# Patient Record
Sex: Female | Born: 1980 | Race: White | Hispanic: No | Marital: Married | Smoking: Never smoker
Health system: Southern US, Community
[De-identification: ages and names within clinical notes are randomized; demographics above are authoritative.]

## PROBLEM LIST (undated history)

## (undated) DIAGNOSIS — T7840XA Allergy, unspecified, initial encounter: Secondary | ICD-10-CM

## (undated) HISTORY — PX: APPENDECTOMY: SHX54

## (undated) HISTORY — DX: Allergy, unspecified, initial encounter: T78.40XA

---

## 2011-06-19 ENCOUNTER — Ambulatory Visit: Payer: Self-pay | Admitting: Family Medicine

## 2011-06-19 VITALS — BP 91/60 | HR 60 | Temp 97.8°F | Resp 16 | Ht 66.0 in | Wt 163.4 lb

## 2011-06-19 DIAGNOSIS — J310 Chronic rhinitis: Secondary | ICD-10-CM

## 2011-06-19 DIAGNOSIS — H6120 Impacted cerumen, unspecified ear: Secondary | ICD-10-CM

## 2011-06-19 MED ORDER — FLUTICASONE PROPIONATE 50 MCG/ACT NA SUSP: 2.0000 | Freq: Every day | NASAL | Status: DC

## 2011-06-19 MED ORDER — AZELASTINE HCL 0.15 % NA SOLN: 2.0000 | Freq: Two times a day (BID) | NASAL | Status: DC

## 2011-06-19 MED ORDER — AZITHROMYCIN 250 MG PO TABS: ORAL_TABLET | ORAL | Status: AC

## 2011-06-19 NOTE — Progress Notes (Signed)
Subjective: 31 year old lady with a history of having had a little cold last week. She is a little Zicam for that intermittently. She was feeling better. Then sent Monday she has had nasal congestion and frontal sinus and maxillary sinus type pressure. Her ears feel stuffy. No fever. Not much of a cough or sore throat now.  Objective: TMs are both occluded by cerumen it's soft-looking wax. Her nose as pale swelling of the turbinates and is closed up completely. Throat was clear. Neck supple without significant nodes. Chest is clear to auscultation. Heart regular.  Assessment: Allergic or viral rhinitis Recent URI Cerumen impaction  Plan: Treat symptomatically. If she is not doing better over the next several days she can start an oral course of antibiotics, but it is not or weakness there.

## 2011-06-19 NOTE — Patient Instructions (Signed)
Use Debrox in ears  If worse next few days take the antibiotics (zithromax)

## 2012-01-03 ENCOUNTER — Ambulatory Visit (INDEPENDENT_AMBULATORY_CARE_PROVIDER_SITE_OTHER): Payer: BLUE CROSS/BLUE SHIELD | Admitting: Family Medicine

## 2012-01-03 VITALS — BP 95/61 | HR 66 | Temp 98.2°F | Resp 16 | Ht 66.5 in | Wt 164.6 lb

## 2012-01-03 DIAGNOSIS — T7840XA Allergy, unspecified, initial encounter: Secondary | ICD-10-CM

## 2012-01-03 MED ORDER — CETIRIZINE HCL 10 MG PO TABS: 10.0000 mg | ORAL_TABLET | Freq: Every day | ORAL | Status: AC

## 2012-01-03 MED ORDER — RANITIDINE HCL 300 MG PO TABS: 300.0000 mg | ORAL_TABLET | Freq: Two times a day (BID) | ORAL | Status: DC

## 2012-01-03 MED ORDER — METHYLPREDNISOLONE ACETATE 80 MG/ML IJ SUSP
80.0000 mg | Freq: Once | INTRAMUSCULAR | Status: AC
  Administered 2012-01-03: 80 mg via INTRAMUSCULAR

## 2012-01-03 NOTE — Patient Instructions (Addendum)
Angioedema Angioedema (AE) is a sudden swelling of the eyelids, lips, lobes of ears, external genitalia, skin, and other parts of the body. AE can happen by itself. It usually begins during the night and is found on awakening. It can happen with hives and other allergic reactions. Attacks can be mild and annoying, or life-threatening if the air passages swell. AE generally occurs in a short time period (over minutes to hours) and gets better in 24 to 48 hours. It usually does not cause any serious problems.  There are 2 different kinds of AE:   Allergic AE.  Nonallergic AE.  There may be an overreaction or direct stimulation of cells that are a part of the immune system (mast cells).  There may be problems with the release of chemicals made by the body that cause swelling and inflammation (kinins). AE due to kinins can be inherited from parents (hereditary), or it can develop on its own (acquired). Acquired AE either shows up before, or along with, certain diseases or is due to the body's immune system attacking parts of the body's own cells (autoimmune). CAUSES  Allergic  AE due to allergic reactions are caused by something that causes the body to react (trigger). Common triggers include:  Foods.  Medicines.  Latex.  Direct contact with certain fruits, vegetables, or animal saliva.  Insect stings. Nonallergic  Mast cell stimulation may be caused by:  Medicines.  Dyes used in X-rays.  The body's own immune system reactions to parts of the body (autoimmune disease).  Possibly, some virus infections.  AE due to problems with kinins can be hereditary or acquired. Attacks are triggered by:  Mild injury.  Dental work or any surgery.  Stress.  Sudden changes in temperature.  Exercise.  Medicines.  AE due to problems with kinins can also be due to certain medicines, especially blood pressure medicines like angiotensin-converting enzyme (ACE) inhibitors. African Americans  are at nearly 5 times greater risk of developing AE than Caucasians from ACE inhibitors. SYMPTOMS  Allergic symptoms:  Non-itchy swelling of the skin. Often the swelling is on the face and lips, but any area of the skin can swell. Sometimes, the swelling can be painful. If hives are present, there is intense itching.  Breathing problems if the air passages swell. Nonallergic symptoms:  If internal organs are involved, there may be:  Nausea.  Abdominal pain.  Vomiting.  Difficulty swallowing.  Difficulty passing urine.  Breathing problems if the air passages swell. Depending on the cause of AE, episodes may:  Only happen once (if triggers are removed or avoided).  Come back in unpredictable patterns.  Repeat for several years and then gradually fade away. DIAGNOSIS  AE is diagnosed by:   Asking questions to find out how fast the symptoms began.  Taking a family history.  Physical exam.  Diagnostic tests. Tests could include:  Allergy skin tests to see if the problem is allergic.  Blood tests to diagnose hereditary and some acquired types of AE.  Other tests to see if there is a hidden disease leading to the AE. TREATMENT  Treatment depends on the type and cause (if any) of the AE. Allergic  Allergic types of AE are treated with:  Immediate removal of the trigger or medicine (if any).  Epinephrine injection.  Steroids.  Antihistamines.  Hospitalization for severe attacks. Nonallergic  Mast cell stimulation types of AE are treated with:  Immediate removal of the trigger or medicine (if any).  Epinephrine injection.    Allergic   Allergic types of AE are treated with:   Immediate removal of the trigger or medicine (if any).   Epinephrine injection.   Steroids.   Antihistamines.   Hospitalization for severe attacks.  Nonallergic   Mast cell stimulation types of AE are treated with:   Immediate removal of the trigger or medicine (if any).   Epinephrine injection.   Steroids.   Antihistamines.   Hospitalization for severe attacks.   Hereditary AE is treated with:   Medicines to prevent and treat attacks. There is little response to antihistamines, epinephrine, or steroids.   Preventive medicines before dental work or surgery.   Removing or avoiding medicines that trigger  attacks.   Hospitalization for severe attacks.   Acquired AE is treated with:   Treating underlying disease (if any).   Medicines to prevent and treat attacks.  HOME CARE INSTRUCTIONS    Always carry your emergency allergy treatment medicines with you.   Wear a medical bracelet.   Avoid known triggers.  SEEK MEDICAL CARE IF:    You get repeat attacks.   Your attacks are more frequent or more severe despite preventive measures.   You have hereditary AE and are considering having children. It is important to discuss the risks of passing this on to your children.  SEEK IMMEDIATE MEDICAL CARE IF:    You have difficulty breathing.   You have difficulty swallowing.   You experience fainting.  This condition should be treated immediately. It can be life-threatening if it involves throat swelling.  Document Released: 04/01/2001 Document Revised: 04/15/2011 Document Reviewed: 01/21/2008  Exit[employer] Patient Information 2013 [employer].

## 2012-01-03 NOTE — Progress Notes (Signed)
Subjective:    Patient ID: Becky Love, female    DOB: December 02, 1980, 31 y.o.   MRN: [id number] Chief Complaint  Patient presents with  . Facial Swelling    slightly hard to swallow since last night    HPI  Eyes swollen. Started a little last night but today has been rapidly worsening. Both eyes markedly puffy and red.  No new foods. Her family brought some food to thanksgiving but no shellfish, nut, or fruit exposures that she is aware of. Only new medication was a aleve-d sinus and cold tab which she had taken yesterday but has had the components (sudafed and aleve) many time prior.  Does have bad seasonal allergies so taking azelastine nasal spray and flonase and occ ibuprofen but no other meds or supplements. No rashes, itching. No bug bites.  Does feel like it is a little hard to swallow but is drinking fine and no SHoB. Has never had reaction like this prior.  Past Medical History  Diagnosis Date  . Allergy    Current Outpatient Prescriptions on File Prior to Visit  Medication Sig Dispense Refill  . Azelastine HCl 0.15 % SOLN Place 2 sprays into the nose 2 (two) times daily.  30 mL  1        . fluticasone (FLONASE) 50 MCG/ACT nasal spray Place 2 sprays into the nose daily.  16 g  1           Review of Systems  Constitutional: Negative for fever, chills and diaphoresis.  HENT: Positive for facial swelling and trouble swallowing. Negative for congestion, sore throat, rhinorrhea, sneezing, drooling, neck pain, neck stiffness, voice change, postnasal drip and sinus pressure.   Eyes: Positive for discharge. Negative for pain, redness, itching and visual disturbance.  Respiratory: Positive for chest tightness. Negative for cough and shortness of breath.   Cardiovascular: Negative for chest pain, palpitations and leg swelling.  Gastrointestinal: Negative for nausea, vomiting, abdominal pain, diarrhea and constipation.  Musculoskeletal: Negative for myalgias, joint swelling and  arthralgias.  Skin: Positive for color change and rash. Negative for pallor and wound.  Neurological: Negative for dizziness, syncope, weakness and light-headedness.  Hematological: Negative for adenopathy. Does not bruise/bleed easily.  Psychiatric/Behavioral: Negative for sleep disturbance. The patient is not nervous/anxious.       BP 95/61  Pulse 66  Temp 98.2 F (36.8 C) (Oral)  Resp 16  Ht 5' 6.5" (1.689 m)  Wt 164 lb 9.6 oz (74.662 kg)  BMI 26.17 kg/m2  SpO2 100% Objective:   Physical Exam  Constitutional: She is oriented to person, place, and time. She appears well-developed and well-nourished. No distress.  HENT:  Head: Normocephalic and atraumatic.  Right Ear: Tympanic membrane, external ear and ear canal normal.  Left Ear: Tympanic membrane, external ear and ear canal normal.  Nose: Mucosal edema and rhinorrhea present. No sinus tenderness. Right sinus exhibits maxillary sinus tenderness. Left sinus exhibits maxillary sinus tenderness.  Mouth/Throat: Uvula is midline, oropharynx is clear and moist and mucous membranes are normal. No oropharyngeal exudate, posterior oropharyngeal edema, posterior oropharyngeal erythema or tonsillar abscesses.       Mucosal edema very significant - nares swollen shut on right with prominent rhinorrhea  Eyes: Conjunctivae normal and EOM are normal. Pupils are equal, round, and reactive to light. Right eye exhibits no discharge. Left eye exhibits no discharge. No scleral icterus.       Upper and lower lids markedly red and swollen - very dramatic  Neck: Normal  range of motion. Neck supple.  Cardiovascular: Normal rate, regular rhythm, normal heart sounds and intact distal pulses.   Pulmonary/Chest: Effort normal and breath sounds normal.  Lymphadenopathy:       Head (right side): Submandibular adenopathy present. No preauricular and no posterior auricular adenopathy present.       Head (left side): Submandibular adenopathy present. No  preauricular and no posterior auricular adenopathy present.    She has no cervical adenopathy.       Right: No supraclavicular adenopathy present.       Left: No supraclavicular adenopathy present.  Neurological: She is alert and oriented to person, place, and time.  Skin: Skin is warm and dry. She is not diaphoretic. No erythema.  Psychiatric: She has a normal mood and affect. Her behavior is normal.          Assessment & Plan:   1. Allergic reaction  ranitidine (ZANTAC) 300 MG tablet, cetirizine (ZYRTEC) 10 MG tablet, methylPREDNISolone acetate (DEPO-MEDROL) injection 80 mg  Depomedrol 80mg  IM x 1 in office now and start zantac and zyrtec. Unknown inciting event so if ever begins to happen start antihistamines immed and RTC as will need referral for allergy testing if she can't identify trigger.

## 2012-01-30 ENCOUNTER — Ambulatory Visit (INDEPENDENT_AMBULATORY_CARE_PROVIDER_SITE_OTHER): Payer: BC Managed Care – PPO | Admitting: Emergency Medicine

## 2012-01-30 VITALS — BP 120/85 | HR 76 | Temp 97.9°F | Resp 16 | Ht 65.5 in | Wt 169.0 lb

## 2012-01-30 DIAGNOSIS — H1010 Acute atopic conjunctivitis, unspecified eye: Secondary | ICD-10-CM

## 2012-01-30 MED ORDER — OLOPATADINE HCL 0.1 % OP SOLN: 1.0000 [drp] | Freq: Two times a day (BID) | OPHTHALMIC | Status: DC

## 2012-01-30 MED ORDER — METHYLPREDNISOLONE ACETATE 80 MG/ML IJ SUSP
80.0000 mg | Freq: Once | INTRAMUSCULAR | Status: AC
  Administered 2012-01-30: 80 mg via INTRAMUSCULAR

## 2012-01-30 NOTE — Progress Notes (Signed)
[facility] [address], [place] [address] [phone]  Date:  01/30/2012   Name:  Becky Love   DOB:  19-Aug-1980   MRN:  [id number]  PCP:  No primary provider on file.    Chief Complaint: Eye Problem   History of Present Illness:  Becky Love is a 31 y.o. very pleasant female patient who presents with the following:  Had an allergic reaction on the day following Thanksgiving and was treated here with a steroid injection and zyrtec.  Improved.  Now today has a recurrence of her swelling and redness.  Denies any visual symptoms.  Denies antecedent illness or new allergen exposure.    There is no problem list on file for this patient.   Past Medical History  Diagnosis Date  . Allergy     Past Surgical History  Procedure Date  . Appendectomy     History  Substance Use Topics  . Smoking status: Never Smoker   . Smokeless tobacco: Not on file  . Alcohol Use: Not on file    Family History  Problem Relation Age of Onset  . Cancer Maternal Grandfather   . Heart disease Paternal Grandfather     No Known Allergies  Medication list has been reviewed and updated.  Current Outpatient Prescriptions on File Prior to Visit  Medication Sig Dispense Refill  . Azelastine HCl 0.15 % SOLN Place 2 sprays into the nose 2 (two) times daily.  30 mL  1  . cetirizine (ZYRTEC) 10 MG tablet Take 1 tablet (10 mg total) by mouth daily.  30 tablet  5  . fluticasone (FLONASE) 50 MCG/ACT nasal spray Place 2 sprays into the nose daily.  16 g  1  . ibuprofen (ADVIL,MOTRIN) 200 MG tablet Take 200 mg by mouth every 6 (six) hours as needed.      . Pseudoephedrine-Naproxen Na (ALEVE-D SINUS & COLD PO) Take by mouth. Took one tab yesterday.      . ranitidine (ZANTAC) 300 MG tablet Take 1 tablet (300 mg total) by mouth 2 (two) times daily.  60 tablet  1    Review of Systems:  As per HPI, otherwise negative.    Physical Examination: Filed Vitals:   01/30/12  2026  BP: 120/85  Pulse: 76  Temp: 97.9 F (36.6 C)  Resp: 16   Filed Vitals:   01/30/12 2026  Height: 5' 5.5" (1.664 m)  Weight: 169 lb (76.658 kg)   Body mass index is 27.70 kg/(m^2). Ideal Body Weight: Weight in (lb) to have BMI = 25: 152.2   GEN: WDWN, NAD, Non-toxic, A & O x 3 HEENT: Atraumatic, Normocephalic. Neck supple. No masses, No LAD.  PRRERLA EOMI conjunctiva and sclera clear.  Moderate swelling of lids.  No cellulitis.  No FB. Ears and Nose: No external deformity. CV: RRR, No M/G/R. No JVD. No thrill. No extra heart sounds. PULM: CTA B, no wheezes, crackles, rhonchi. No retractions. No resp. distress. No accessory muscle use. ABD: S, NT, ND, +BS. No rebound. No HSM. EXTR: No c/c/e NEURO Normal gait.  PSYCH: Normally interactive. Conversant. Not depressed or anxious appearing.  Calm demeanor.    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

## 2012-01-30 NOTE — Addendum Note (Signed)
Addended by: Carmelina Dane on: 01/30/2012 09:10 PM   Modules accepted: Orders

## 2013-04-09 ENCOUNTER — Ambulatory Visit (INDEPENDENT_AMBULATORY_CARE_PROVIDER_SITE_OTHER): Payer: BC Managed Care – PPO | Admitting: Physician Assistant

## 2013-04-09 VITALS — BP 102/68 | HR 60 | Temp 98.8°F | Resp 16 | Ht 66.0 in | Wt 172.0 lb

## 2013-04-09 DIAGNOSIS — R110 Nausea: Secondary | ICD-10-CM

## 2013-04-09 DIAGNOSIS — R5383 Other fatigue: Secondary | ICD-10-CM

## 2013-04-09 DIAGNOSIS — J069 Acute upper respiratory infection, unspecified: Secondary | ICD-10-CM

## 2013-04-09 DIAGNOSIS — R5381 Other malaise: Secondary | ICD-10-CM

## 2013-04-09 DIAGNOSIS — B2780 Other infectious mononucleosis without complication: Secondary | ICD-10-CM

## 2013-04-09 DIAGNOSIS — B349 Viral infection, unspecified: Secondary | ICD-10-CM

## 2013-04-09 LAB — POCT CBC
Granulocyte percent: 48.8 %G (ref 37–80)
HEMATOCRIT: 41.7 % (ref 37.7–47.9)
HEMOGLOBIN: 13.3 g/dL (ref 12.2–16.2)
LYMPH, POC: 2.7 (ref 0.6–3.4)
MCH, POC: 31.1 pg (ref 27–31.2)
MCHC: 31.9 g/dL (ref 31.8–35.4)
MCV: 97.5 fL — AB (ref 80–97)
MID (cbc): 0.4 (ref 0–0.9)
MPV: 9 fL (ref 0–99.8)
POC Granulocyte: 3 (ref 2–6.9)
POC LYMPH PERCENT: 44 %L (ref 10–50)
POC MID %: 7.2 %M (ref 0–12)
Platelet Count, POC: 333 10*3/uL (ref 142–424)
RBC: 4.28 M/uL (ref 4.04–5.48)
RDW, POC: 13.6 %
WBC: 6.1 10*3/uL (ref 4.6–10.2)

## 2013-04-09 LAB — POCT INFLUENZA A/B
Influenza A, POC: NEGATIVE
Influenza B, POC: NEGATIVE

## 2013-04-09 LAB — POCT URINE PREGNANCY: Preg Test, Ur: NEGATIVE

## 2013-04-09 MED ORDER — ONDANSETRON 4 MG PO TBDP: 4.0000 mg | ORAL_TABLET | Freq: Three times a day (TID) | ORAL | Status: AC | PRN

## 2013-04-09 MED ORDER — IPRATROPIUM BROMIDE 0.06 % NA SOLN: 2.0000 | Freq: Three times a day (TID) | NASAL | Status: AC

## 2013-04-09 NOTE — Progress Notes (Signed)
Subjective:    Patient ID: Becky Love, female    DOB: May 02, 1980, 33 y.o.   MRN: [id number]  HPI Primary Physician: No primary provider on file.  Chief Complaint: ? Illness and fever x 1 day  HPI: 33 y.o. female with history below presents with 1 day history of a tickle in her throat, post nasal drip, sneezing, headache, nausea, diarrhea, and fatigue. Subjective fever and chills. T max 99.5 this morning. This has resolved without the aid of an antipyretic. No cough, congestion, rhinorrhea, sinus pressure, otalgia, or ear fullness. She is worried that she is sick. Has not yet tried anything. She did not get an influenza vaccine this year. Lots of students sick with bronchitis. 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   LMP either the first week of February or the last week of January. Periods are usually regular, but can sometimes be a little irregular. Sexually active with one female partner. Uses condoms for protection.    Past Medical History  Diagnosis Date  . Allergy      Home Meds: Prior to Admission medications   Medication Sig Start Date End Date Taking? Authorizing Provider  cetirizine (ZYRTEC) 10 MG tablet Take 1 tablet (10 mg total) by mouth daily. 01/03/12  Yes Sherren Mocha, MD  ibuprofen (ADVIL,MOTRIN) 200 MG tablet Take 200 mg by mouth every 6 (six) hours as needed.   Yes Historical Provider, MD                         Allergies:  Allergies  Allergen Reactions  . Penicillins     History   Social History  . Marital Status: Married    Spouse Name: N/A    Number of Children: N/A  . Years of Education: N/A   Occupational History  . Not on file.   Social History Main Topics  . Smoking status: Never Smoker   . Smokeless tobacco: Not on file  . Alcohol Use: Not on file  . Drug Use: No  . Sexual Activity: Yes    Birth Control/ Protection: Condom     Comment: 1 partner last 12 months   Other Topics Concern  . Not on file   Social History Narrative  . No narrative on  file     Review of Systems  Constitutional: Positive for fever, chills, appetite change and fatigue.       T max 99.5. Appetite decreased the past 2 days  HENT: Positive for postnasal drip, sneezing and sore throat. Negative for congestion, ear pain, hearing loss, rhinorrhea and sinus pressure.        Tickle of the throat.   Respiratory: Negative for cough, shortness of breath and wheezing.   Gastrointestinal: Positive for nausea and diarrhea. Negative for vomiting and abdominal pain.  Musculoskeletal: Positive for myalgias.  Allergic/Immunologic: Positive for environmental allergies.  Neurological: Positive for headaches.       Frontal and bilateral temple headache  Psychiatric/Behavioral: Negative for confusion. The patient is not nervous/anxious.        Objective:   Physical Exam  Physical Exam: Blood pressure 102/68, pulse 60, temperature 98.8 F (37.1 C), resp. rate 16, height 5\' 6"  (1.676 m), weight 172 lb (78.019 kg), last menstrual period 03/12/2013, SpO2 100.00%., Body mass index is 27.77 kg/(m^2). General: Well developed, well nourished, in no acute distress. Head: Normocephalic, atraumatic, eyes without discharge, sclera non-icteric, nares are with clear discharge. Right IAC with cerumen impaction. Status post ear  lavage, bilateral auditory canals clear, TM's are without perforation, pearly grey and translucent with reflective cone of light bilaterally. Oral cavity moist, posterior pharynx without exudate, erythema, peritonsillar abscess, or post nasal drip. Uvula midline.   Neck: Supple. No thyromegaly. Full ROM. No lymphadenopathy. No nuchal rigidity.  Lungs: Clear bilaterally to auscultation without wheezes, rales, or rhonchi. Breathing is unlabored. Heart: RRR with S1 S2. No murmurs, rubs, or gallops appreciated. Msk:  Strength and tone normal for age. Extremities/Skin: Warm and dry. No clubbing or cyanosis. No edema. No rashes or suspicious lesions. Neuro: Alert and  oriented X 3. Moves all extremities spontaneously. Gait is normal. CNII-XII grossly in tact. Psych:  Responds to questions appropriately with a normal affect.   Labs: Results for orders placed in visit on 04/09/13  POCT CBC      Result Value Ref Range   WBC 6.1  4.6 - 10.2 K/uL   Lymph, poc 2.7  0.6 - 3.4   POC LYMPH PERCENT 44.0  10 - 50 %L   MID (cbc) 0.4  0 - 0.9   POC MID % 7.2  0 - 12 %M   POC Granulocyte 3.0  2 - 6.9   Granulocyte percent 48.8  37 - 80 %G   RBC 4.28  4.04 - 5.48 M/uL   Hemoglobin 13.3  12.2 - 16.2 g/dL   HCT, POC 16.141.7  09.637.7 - 47.9 %   MCV 97.5 (*) 80 - 97 fL   MCH, POC 31.1  27 - 31.2 pg   MCHC 31.9  31.8 - 35.4 g/dL   RDW, POC 04.513.6     Platelet Count, POC 333  142 - 424 K/uL   MPV 9.0  0 - 99.8 fL  POCT INFLUENZA A/B      Result Value Ref Range   Influenza A, POC Negative     Influenza B, POC Negative    POCT URINE PREGNANCY      Result Value Ref Range   Preg Test, Ur Negative         Assessment & Plan:  32 year old female with viral URI, fatigue, and nausea -Zofran ODT 4 mg 1 po under tongue q 8 hours prn nausea #20 no RF -Atrovent NS 0.06% 2 sprays each nare bid prn #1 no RF -Tylenol prn headache and fever -Patient to have follow up pregnancy test in 2 weeks if she does have get her menses  -Recheck blood work in 3 -5 days  -Rest/fluids -RTC precautions   Eula Listenyan Farren Nelles, MHS, PA-C [facility] [address] [place], [place] [id number] [phone] [facility] 04/09/2013 7:21 PM

## 2013-04-12 ENCOUNTER — Ambulatory Visit (INDEPENDENT_AMBULATORY_CARE_PROVIDER_SITE_OTHER): Payer: BC Managed Care – PPO | Admitting: Internal Medicine

## 2013-04-12 ENCOUNTER — Telehealth: Payer: Self-pay

## 2013-04-12 VITALS — BP 110/68 | HR 66 | Temp 98.5°F | Resp 16 | Ht 66.0 in | Wt 170.8 lb

## 2013-04-12 DIAGNOSIS — R5381 Other malaise: Secondary | ICD-10-CM

## 2013-04-12 DIAGNOSIS — R5383 Other fatigue: Principal | ICD-10-CM

## 2013-04-12 DIAGNOSIS — R51 Headache: Secondary | ICD-10-CM

## 2013-04-12 LAB — COMPREHENSIVE METABOLIC PANEL
ALBUMIN: 4.4 g/dL (ref 3.5–5.2)
ALK PHOS: 38 U/L — AB (ref 39–117)
ALT: 9 U/L (ref 0–35)
AST: 12 U/L (ref 0–37)
BUN: 9 mg/dL (ref 6–23)
CALCIUM: 9.2 mg/dL (ref 8.4–10.5)
CHLORIDE: 104 meq/L (ref 96–112)
CO2: 28 mEq/L (ref 19–32)
Creat: 0.78 mg/dL (ref 0.50–1.10)
Glucose, Bld: 80 mg/dL (ref 70–99)
POTASSIUM: 4.1 meq/L (ref 3.5–5.3)
SODIUM: 137 meq/L (ref 135–145)
TOTAL PROTEIN: 7.3 g/dL (ref 6.0–8.3)
Total Bilirubin: 0.6 mg/dL (ref 0.2–1.2)

## 2013-04-12 LAB — POCT CBC
Granulocyte percent: 58.8 %G (ref 37–80)
HCT, POC: 41.5 % (ref 37.7–47.9)
Hemoglobin: 13.3 g/dL (ref 12.2–16.2)
LYMPH, POC: 1.5 (ref 0.6–3.4)
MCH: 31.3 pg — AB (ref 27–31.2)
MCHC: 32 g/dL (ref 31.8–35.4)
MCV: 97.6 fL — AB (ref 80–97)
MID (CBC): 0.3 (ref 0–0.9)
MPV: 9.3 fL (ref 0–99.8)
POC Granulocyte: 2.6 (ref 2–6.9)
POC LYMPH %: 34.4 % (ref 10–50)
POC MID %: 6.8 % (ref 0–12)
Platelet Count, POC: 342 10*3/uL (ref 142–424)
RBC: 4.25 M/uL (ref 4.04–5.48)
RDW, POC: 13.2 %
WBC: 4.5 10*3/uL — AB (ref 4.6–10.2)

## 2013-04-12 LAB — TSH: TSH: 2.12 u[IU]/mL (ref 0.350–4.500)

## 2013-04-12 NOTE — Patient Instructions (Signed)

## 2013-04-12 NOTE — Telephone Encounter (Signed)
Patient is calling about her lab results [phone]

## 2013-04-12 NOTE — Progress Notes (Signed)
   Subjective:    Patient ID: Becky PaganiniJennifer G Rotunno, female    DOB: 12/09/1980, 33 y.o.   MRN: [id number]  HPI Patient presents in follow up from Friday.  Patient told to return today and possibly get labwork done.  Provider suspects Mono it was too soon to do labwork on Friday.  Patient says fever has subsided, but she is still very tired, and has no energy. Fatigue, HAs, school teacher. Fever resolved 2 d ago. No urinary sxs. Has a cat Review of Systems     Objective:   Physical Exam  Constitutional: She is oriented to person, place, and time. She appears well-developed and well-nourished. No distress.  HENT:  Right Ear: External ear normal.  Left Ear: External ear normal.  Eyes: EOM are normal. Pupils are equal, round, and reactive to light. No scleral icterus.  Neck: Normal range of motion. Neck supple. No thyromegaly present.  Pulmonary/Chest: Effort normal.  Abdominal: Soft. Bowel sounds are normal. There is no hepatosplenomegaly, splenomegaly or hepatomegaly. Hernia confirmed negative in the right inguinal area and confirmed negative in the left inguinal area.  Musculoskeletal: Normal range of motion.  Lymphadenopathy:    She has no cervical adenopathy.    She has no axillary adenopathy.       Right: No inguinal adenopathy present.       Left: No inguinal adenopathy present.  Neurological: She is alert and oriented to person, place, and time. She exhibits normal muscle tone. Coordination normal.  Skin: No rash noted.  Psychiatric: She has a normal mood and affect.          Assessment & Plan:  Fatigue/HA CBC/Cmet/TSH/EBV panel

## 2013-04-13 LAB — EPSTEIN-BARR VIRUS VCA ANTIBODY PANEL
EBV EA IgG: <5 U/mL (ref <9.0)
EBV NA IgG: 403 U/mL — ABNORMAL HIGH (ref <18.0)
EBV VCA IgG: 368 U/mL — ABNORMAL HIGH (ref <18.0)

## 2013-04-13 NOTE — Telephone Encounter (Signed)
Pt notified of labs. Still very fatigued and lethargic. Wants to know what the next step is and if she needs to see a specialist. Please advise. Thanks

## 2013-04-16 NOTE — Telephone Encounter (Signed)
Viral fatigue can last 1-2 months. RTC sooner to work on the fatigue If you would like.

## 2013-04-18 NOTE — Telephone Encounter (Signed)
LMOM to CB. 

## 2013-04-19 NOTE — Telephone Encounter (Signed)
Spoke to pt, she is aware. She will come back in in 1-2 weeks for evaluation if not feeling better to discuss what steps to take next.

## 2013-04-19 NOTE — Telephone Encounter (Signed)
Left message on machine to call back  

## 2020-03-09 ENCOUNTER — Other Ambulatory Visit: Payer: Self-pay | Admitting: Obstetrics and Gynecology

## 2020-03-09 DIAGNOSIS — R928 Other abnormal and inconclusive findings on diagnostic imaging of breast: Secondary | ICD-10-CM

## 2020-03-28 ENCOUNTER — Ambulatory Visit
Admission: RE | Admit: 2020-03-28 | Discharge: 2020-03-28 | Disposition: A | Discharge status: Home / Self Care | Payer: BC Managed Care – PPO | Source: Ambulatory Visit | Attending: Obstetrics and Gynecology | Admitting: Obstetrics and Gynecology

## 2020-03-28 ENCOUNTER — Other Ambulatory Visit: Payer: Self-pay | Admitting: Obstetrics and Gynecology

## 2020-03-28 ENCOUNTER — Other Ambulatory Visit: Payer: Self-pay

## 2020-03-28 DIAGNOSIS — R928 Other abnormal and inconclusive findings on diagnostic imaging of breast: Secondary | ICD-10-CM

## 2020-03-28 DIAGNOSIS — R599 Enlarged lymph nodes, unspecified: Secondary | ICD-10-CM

## 2020-09-29 ENCOUNTER — Other Ambulatory Visit: Payer: Self-pay

## 2020-09-29 ENCOUNTER — Ambulatory Visit
Admission: RE | Admit: 2020-09-29 | Discharge: 2020-09-29 | Disposition: A | Discharge status: Home / Self Care | Payer: BC Managed Care – PPO | Source: Ambulatory Visit | Attending: Obstetrics and Gynecology | Admitting: Obstetrics and Gynecology

## 2020-09-29 ENCOUNTER — Other Ambulatory Visit: Payer: Self-pay | Admitting: Obstetrics and Gynecology

## 2020-09-29 DIAGNOSIS — R599 Enlarged lymph nodes, unspecified: Secondary | ICD-10-CM

## 2021-04-02 ENCOUNTER — Ambulatory Visit
Admission: RE | Admit: 2021-04-02 | Discharge: 2021-04-02 | Disposition: A | Discharge status: Home / Self Care | Payer: BC Managed Care – PPO | Source: Ambulatory Visit | Attending: Obstetrics and Gynecology | Admitting: Obstetrics and Gynecology

## 2021-04-02 DIAGNOSIS — R599 Enlarged lymph nodes, unspecified: Secondary | ICD-10-CM

## 2022-03-18 ENCOUNTER — Other Ambulatory Visit: Payer: Self-pay | Admitting: Physician Assistant

## 2022-03-18 DIAGNOSIS — R928 Other abnormal and inconclusive findings on diagnostic imaging of breast: Secondary | ICD-10-CM

## 2022-04-24 ENCOUNTER — Encounter: Payer: Self-pay | Admitting: Physician Assistant

## 2022-04-30 ENCOUNTER — Ambulatory Visit
Admission: RE | Admit: 2022-04-30 | Discharge: 2022-04-30 | Disposition: A | Discharge status: Home / Self Care | Payer: BC Managed Care – PPO | Source: Ambulatory Visit | Attending: Physician Assistant | Admitting: Physician Assistant

## 2022-04-30 DIAGNOSIS — R928 Other abnormal and inconclusive findings on diagnostic imaging of breast: Secondary | ICD-10-CM

## 2023-01-16 ENCOUNTER — Other Ambulatory Visit: Payer: Self-pay | Admitting: Physician Assistant

## 2023-01-16 DIAGNOSIS — Z0000 Encounter for general adult medical examination without abnormal findings: Secondary | ICD-10-CM

## 2023-05-01 ENCOUNTER — Ambulatory Visit
Admission: RE | Admit: 2023-05-01 | Discharge: 2023-05-01 | Disposition: A | Discharge status: Home / Self Care | Payer: BC Managed Care – PPO | Source: Ambulatory Visit | Attending: Physician Assistant

## 2023-05-01 DIAGNOSIS — Z0000 Encounter for general adult medical examination without abnormal findings: Secondary | ICD-10-CM

## 2023-09-08 IMAGING — MG DIGITAL DIAGNOSTIC BILAT W/ TOMO W/ CAD
8 series · 8 of 24 positions shown · non-contrast
Comparison: Previous exam(s).

CLINICAL DATA: One year follow-up of probable benign lymph nodes in
the right breast.

EXAM:
DIGITAL DIAGNOSTIC BILATERAL MAMMOGRAM WITH TOMOSYNTHESIS AND CAD;
ULTRASOUND RIGHT BREAST LIMITED
TECHNIQUE: Bilateral digital diagnostic mammography and breast tomosynthesis
was performed. The images were evaluated with computer-aided
detection.; Targeted ultrasound examination of the right breast was
performed

[R MLO synth-2D]
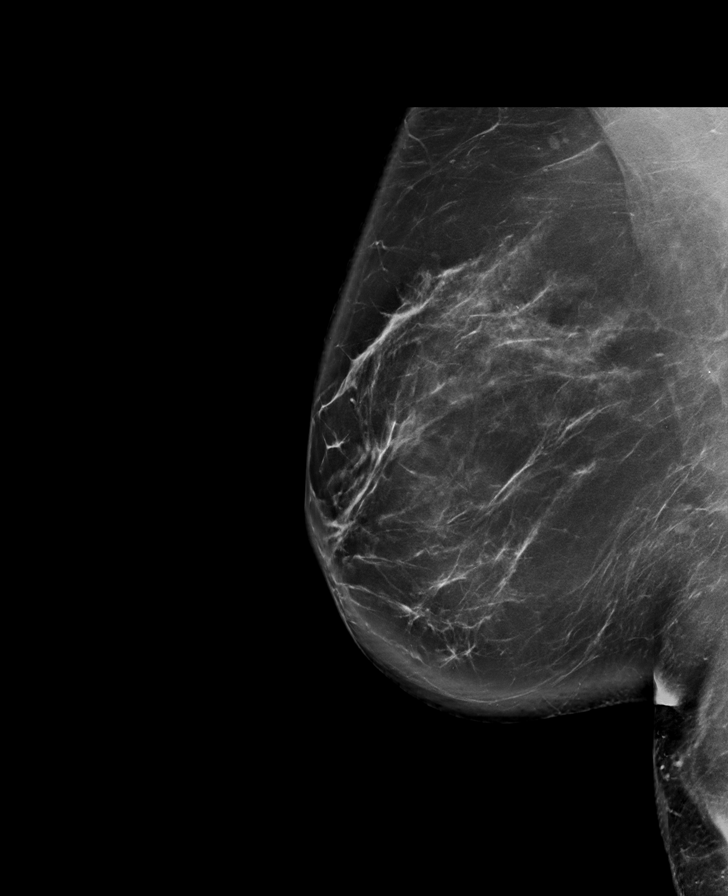

[L MLO synth-2D]
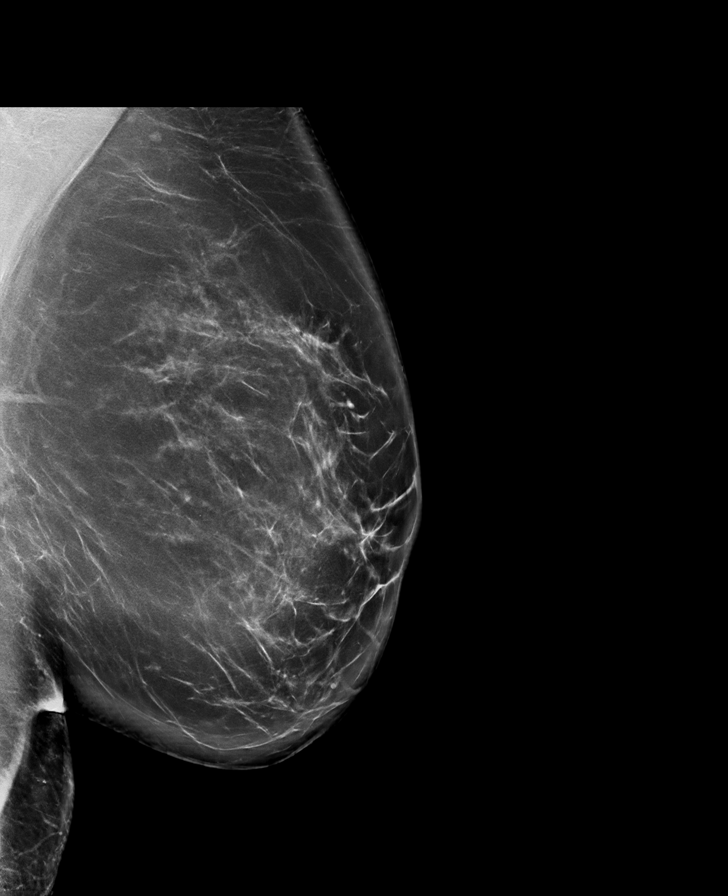

[L CC synth-2D]
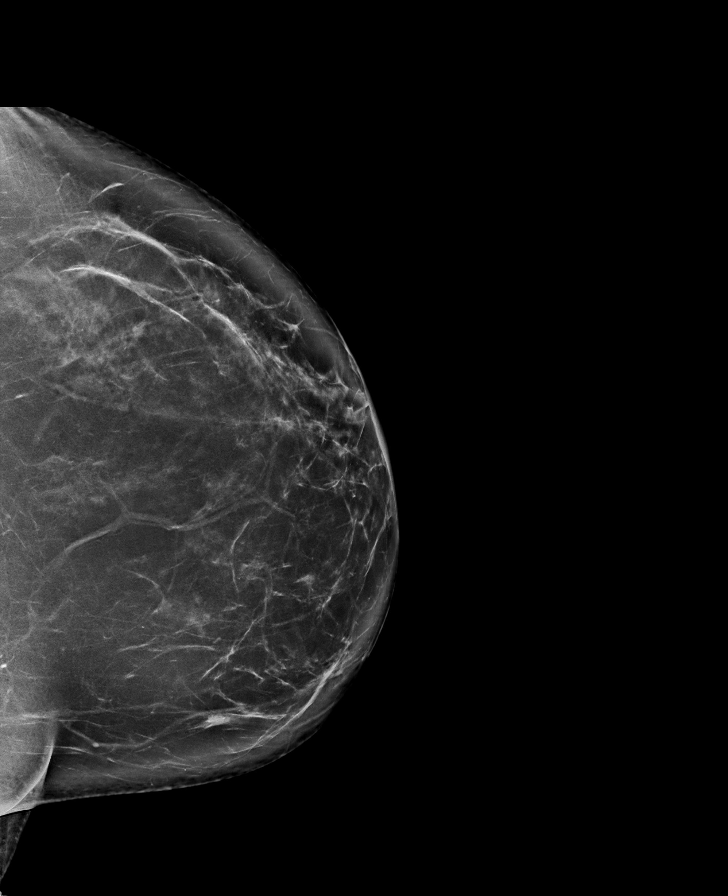

[R CC synth-2D]
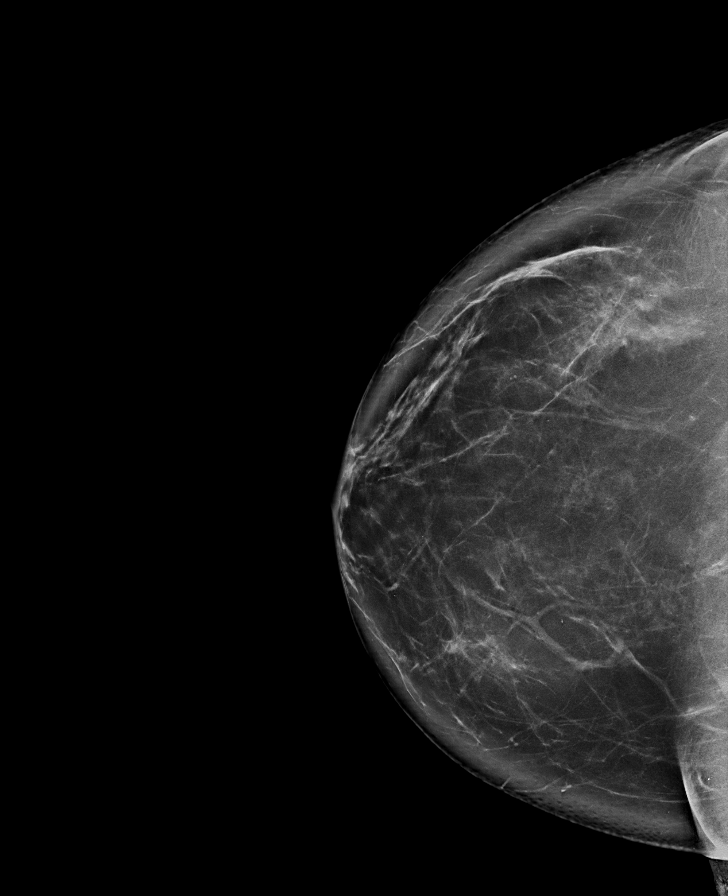

[L CC tomo · tomo slice 49/97.0]
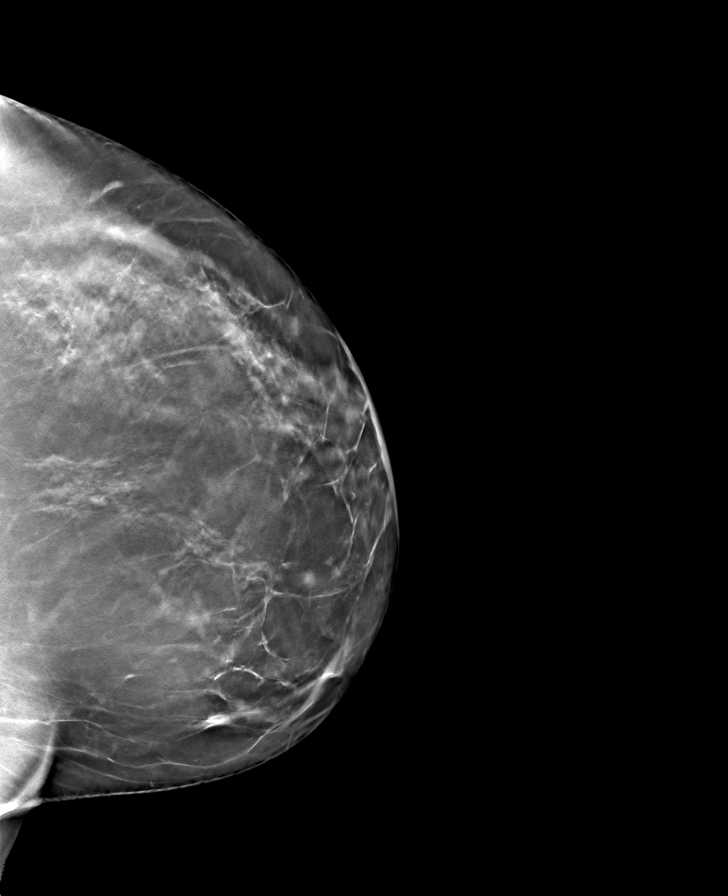

[R CC tomo · tomo slice 52/103.0]
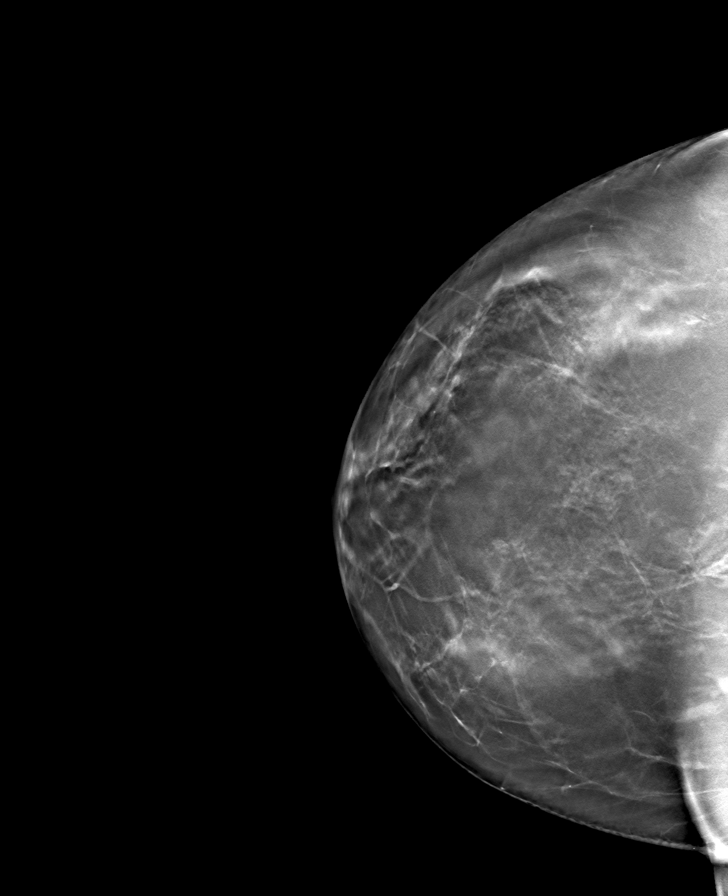

[R MLO tomo · tomo slice 55/108.0]
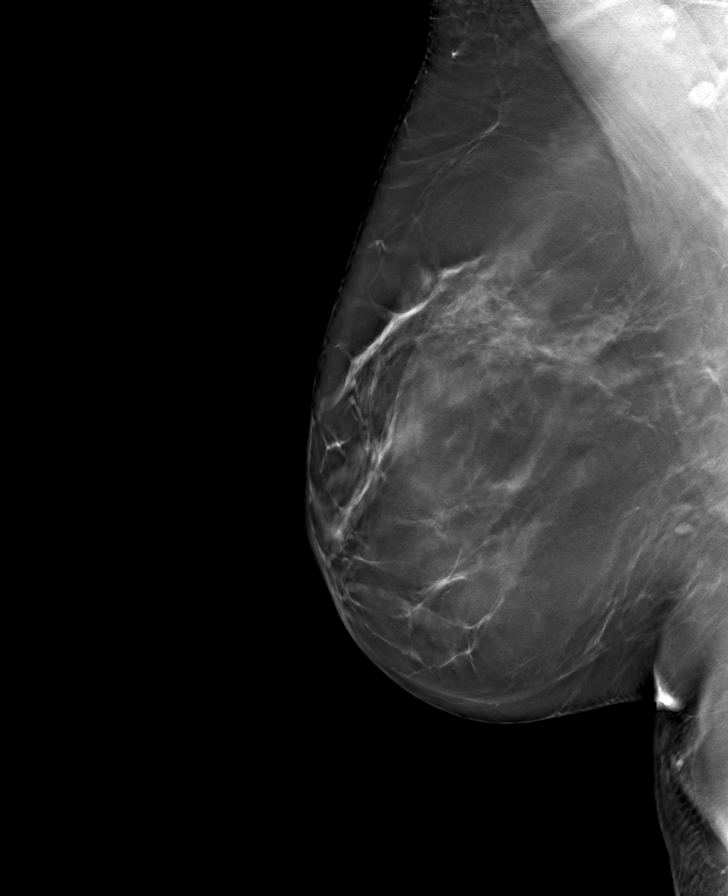

[L MLO tomo · tomo slice 55/109.0]
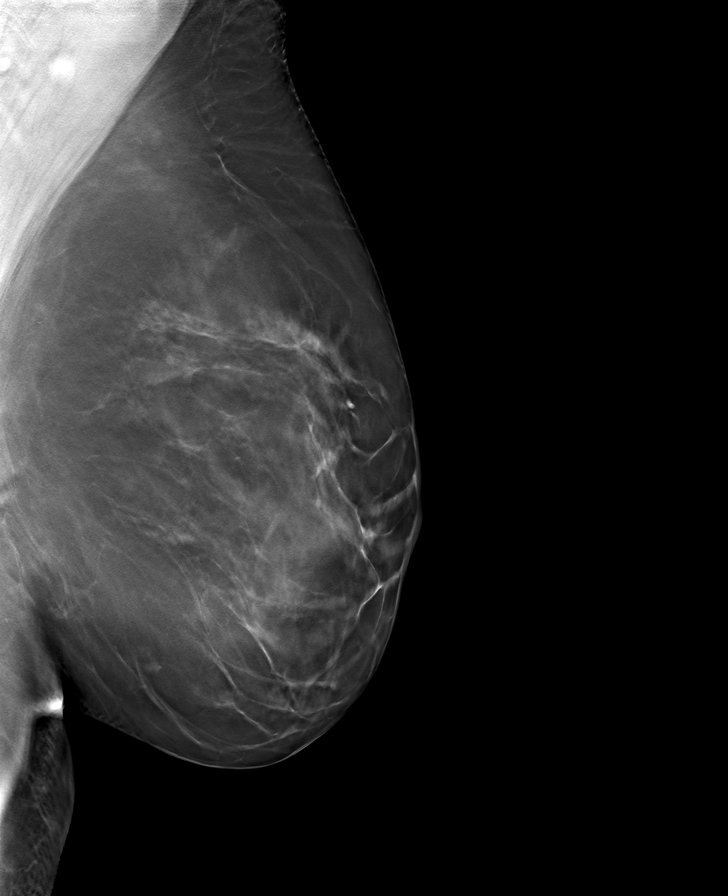

[8 of 24 positions shown; findings below may reference images not displayed]

ACR Breast Density Category b: There are scattered areas of
fibroglandular density.
FINDINGS: There are 2 stable oval masses in the posterior inner right breast.
No suspicious mass or malignant type microcalcifications identified
in either breast.

Targeted ultrasound is performed, showing stable probable benign
intramammary lymph nodes in the right breast at 4 o'clock 6 cm from
the nipple measuring 8 x 5 x 7 mm and 5 x 2 x 4 mm.
IMPRESSION: Stable probable benign intramammary lymph nodes in the right breast.
No evidence of malignancy in the left breast.

RECOMMENDATION:
Bilateral diagnostic mammogram and possible right ultrasound in 1
year is recommended to document stability of the right breast for 2
years.

I have discussed the findings and recommendations with the patient.
If applicable, a reminder letter will be sent to the patient
regarding the next appointment.

BI-RADS CATEGORY  3: Probably benign.
